# Patient Record
Sex: Female | Born: 1995 | Race: White | Hispanic: No | Marital: Single | Smoking: Never smoker
Health system: Southern US, Community
[De-identification: ages and names within clinical notes are randomized; demographics above are authoritative.]

## PROBLEM LIST (undated history)

## (undated) DIAGNOSIS — R011 Cardiac murmur, unspecified: Secondary | ICD-10-CM

## (undated) HISTORY — DX: Cardiac murmur, unspecified: R01.1

---
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
## 2014-06-13 IMAGING — CR NASAL BONES - 3+ VIEW
1 series · 3 of 3 positions shown · non-contrast
Comparison: none

REASON FOR EXAM: nosebleed s/p trauma
COMMENTS:

PROCEDURE:     DXR - DXR NASAL BONES  - February 09, 2013  [DATE]
RESULT:     Images of the nasal bones show no fracture or bony destruction.

[Series 1: pa waters · 0.17mm/px · 3 of 3 slices shown]
[im 1/3]
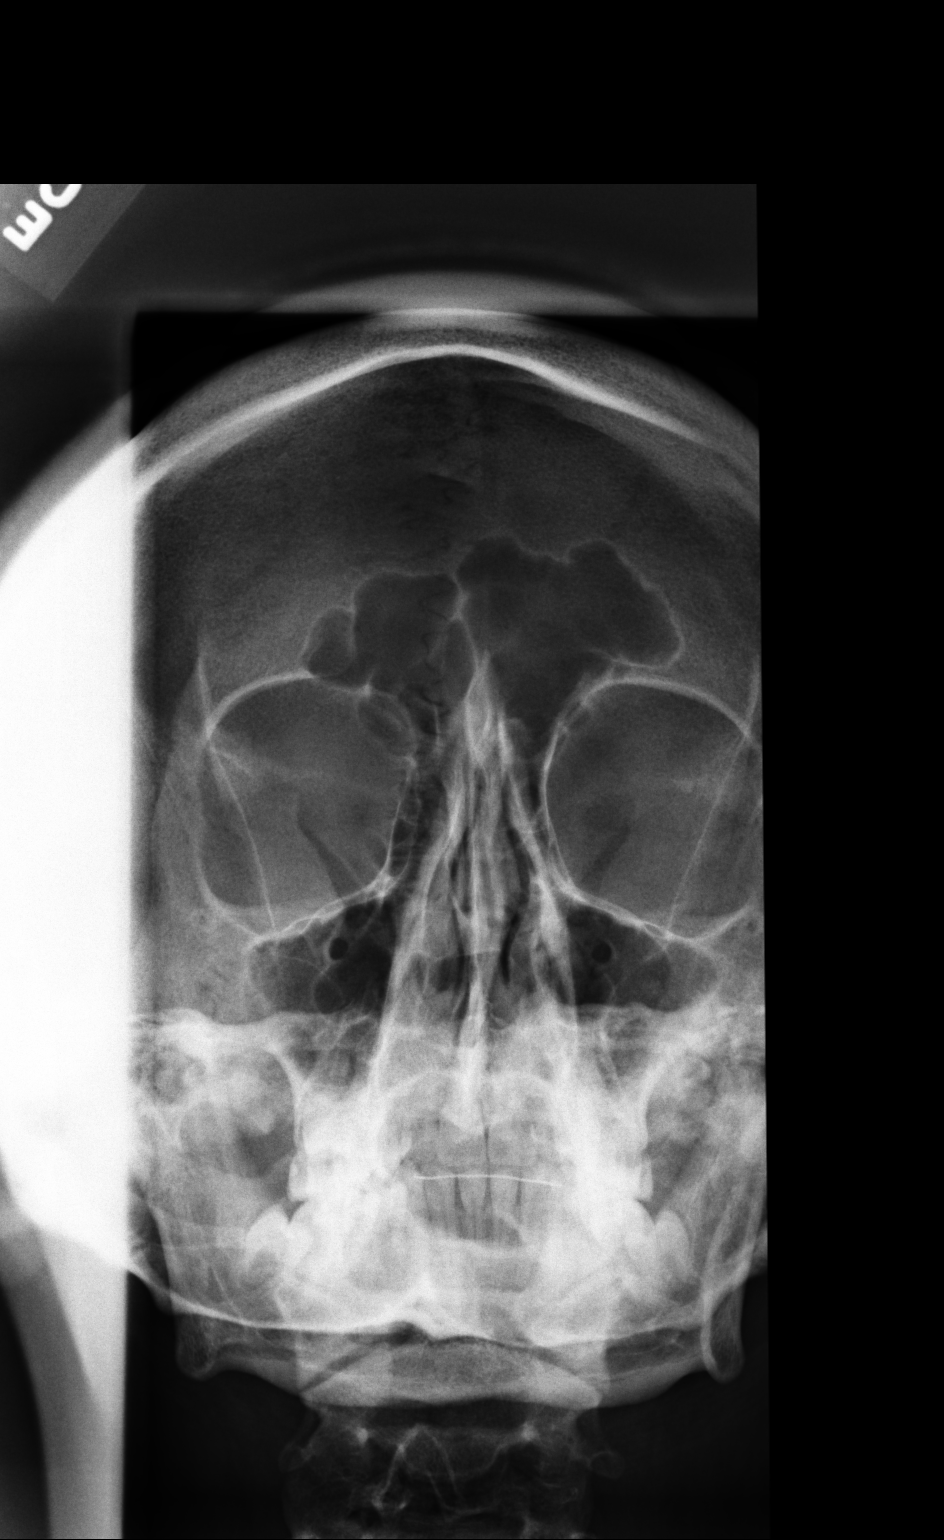
[im 2/3]
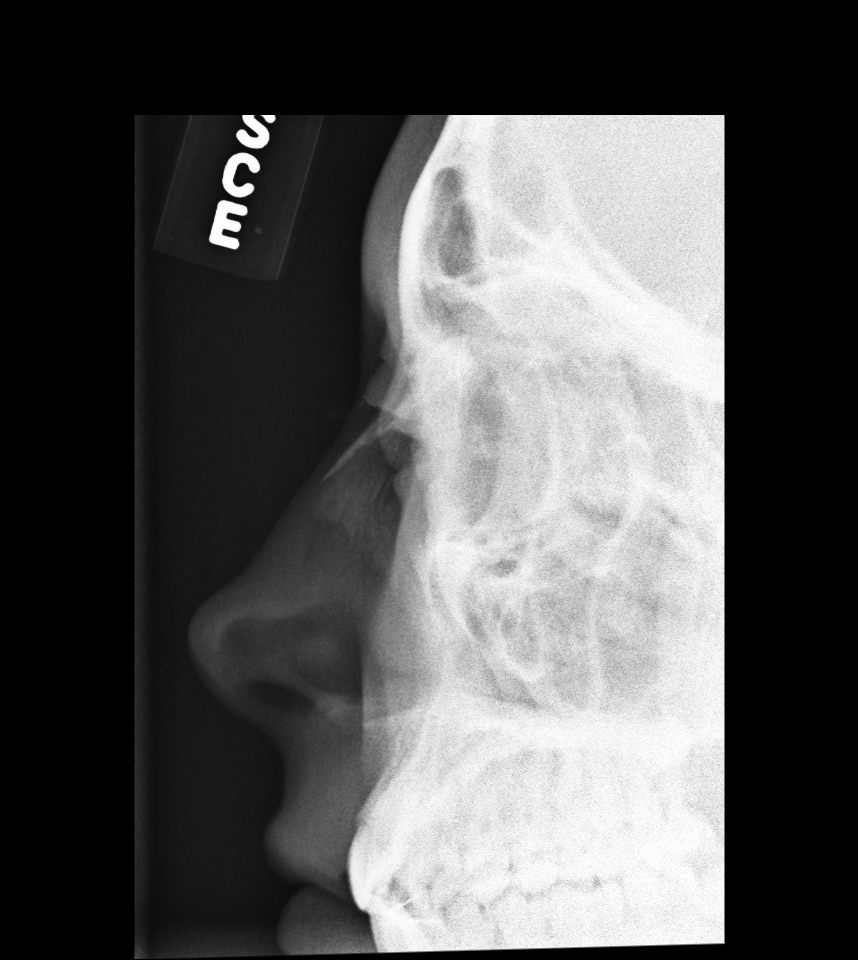
[im 3/3]
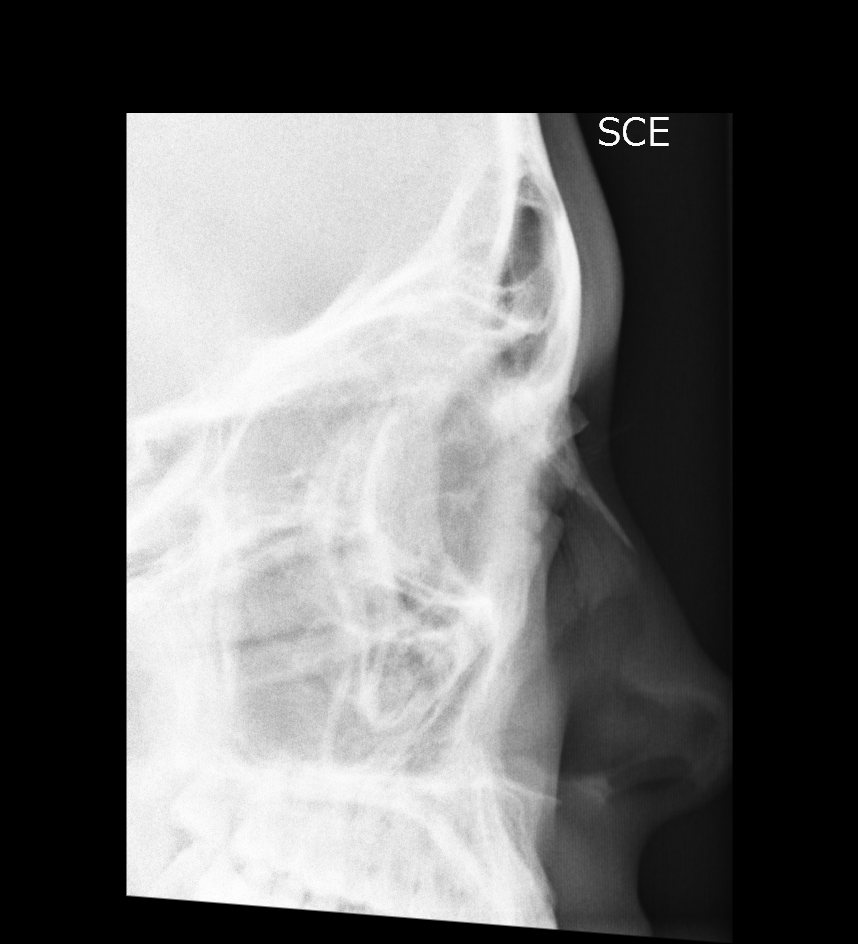

[3 of 3 positions shown; findings below may reference images not displayed]

IMPRESSION: No acute bony abnormality.

[REDACTED]

## 2014-09-11 IMAGING — CR DG THORACIC SPINE 2V
3 series · 3 of 3 positions shown · non-contrast
Comparison: None.

CLINICAL DATA: Back pain scoliosis evaluation.

EXAM:
THORACIC SPINE - 2 VIEW

[view not recorded (1 of 3)]
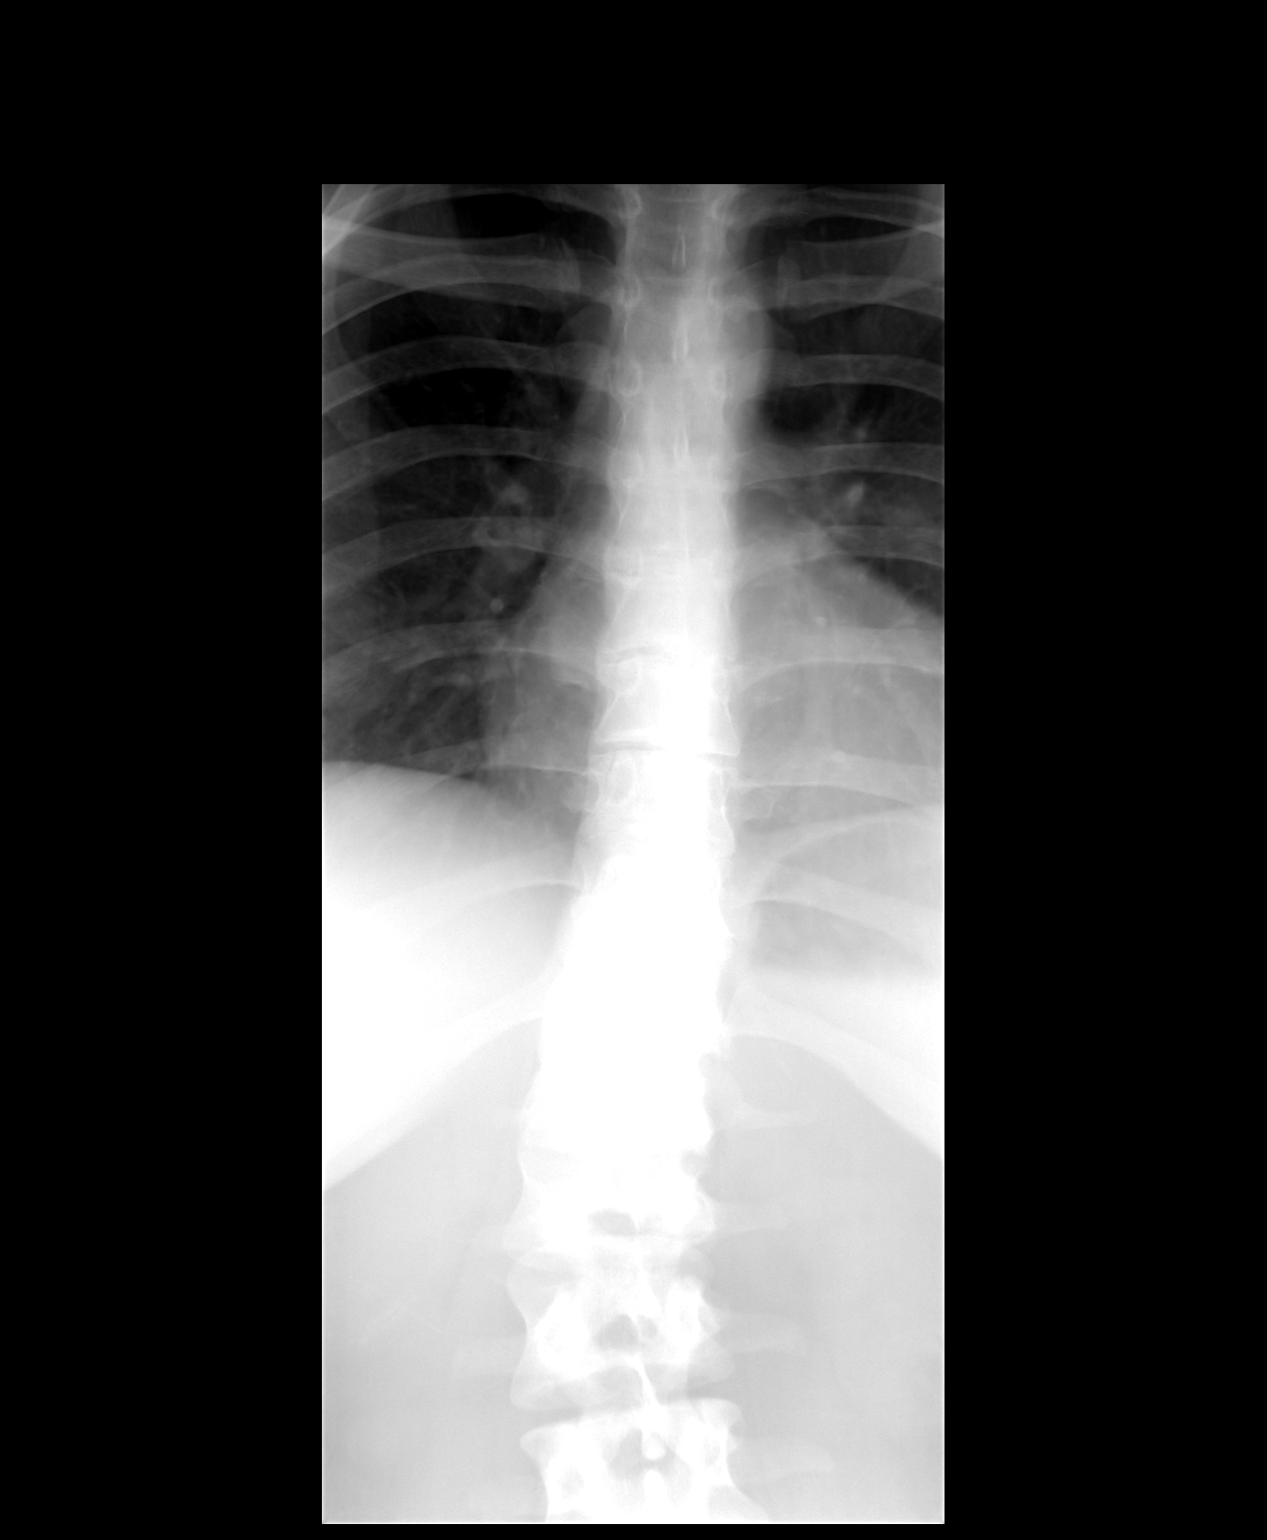

[view not recorded (2 of 3)]
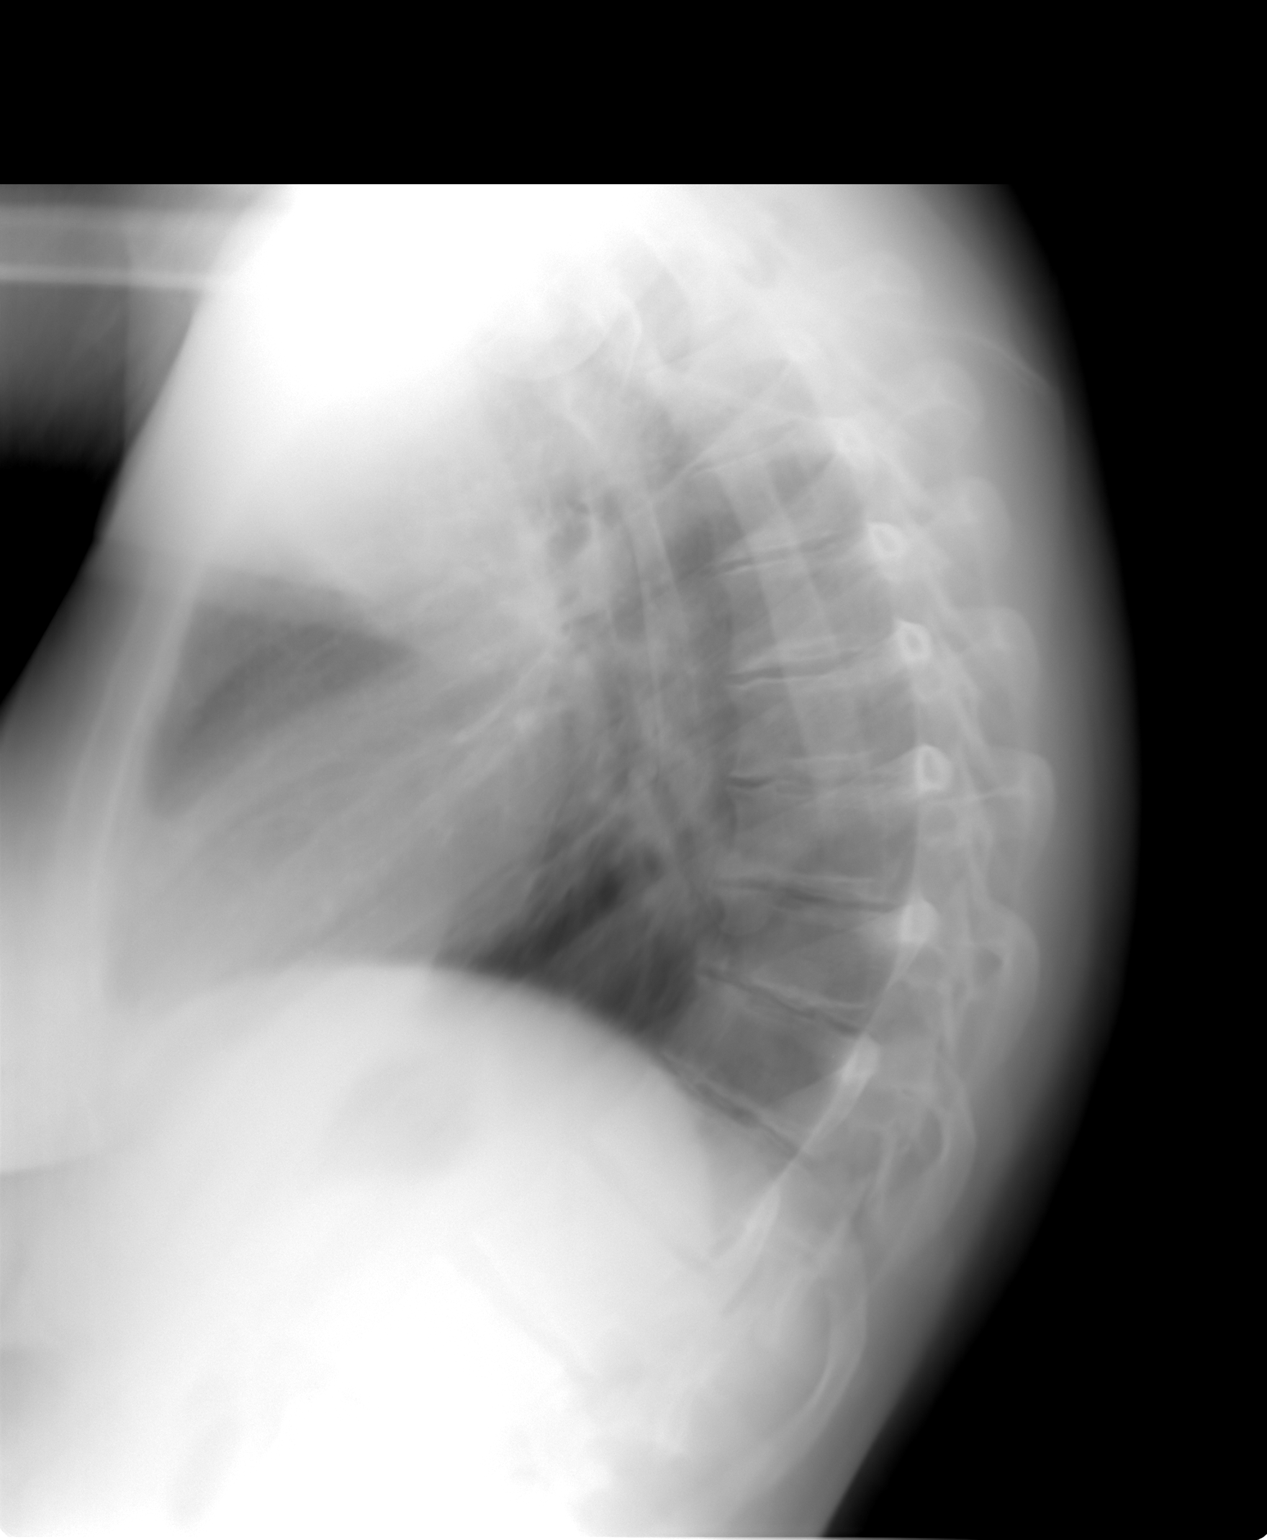

[view not recorded (3 of 3)]
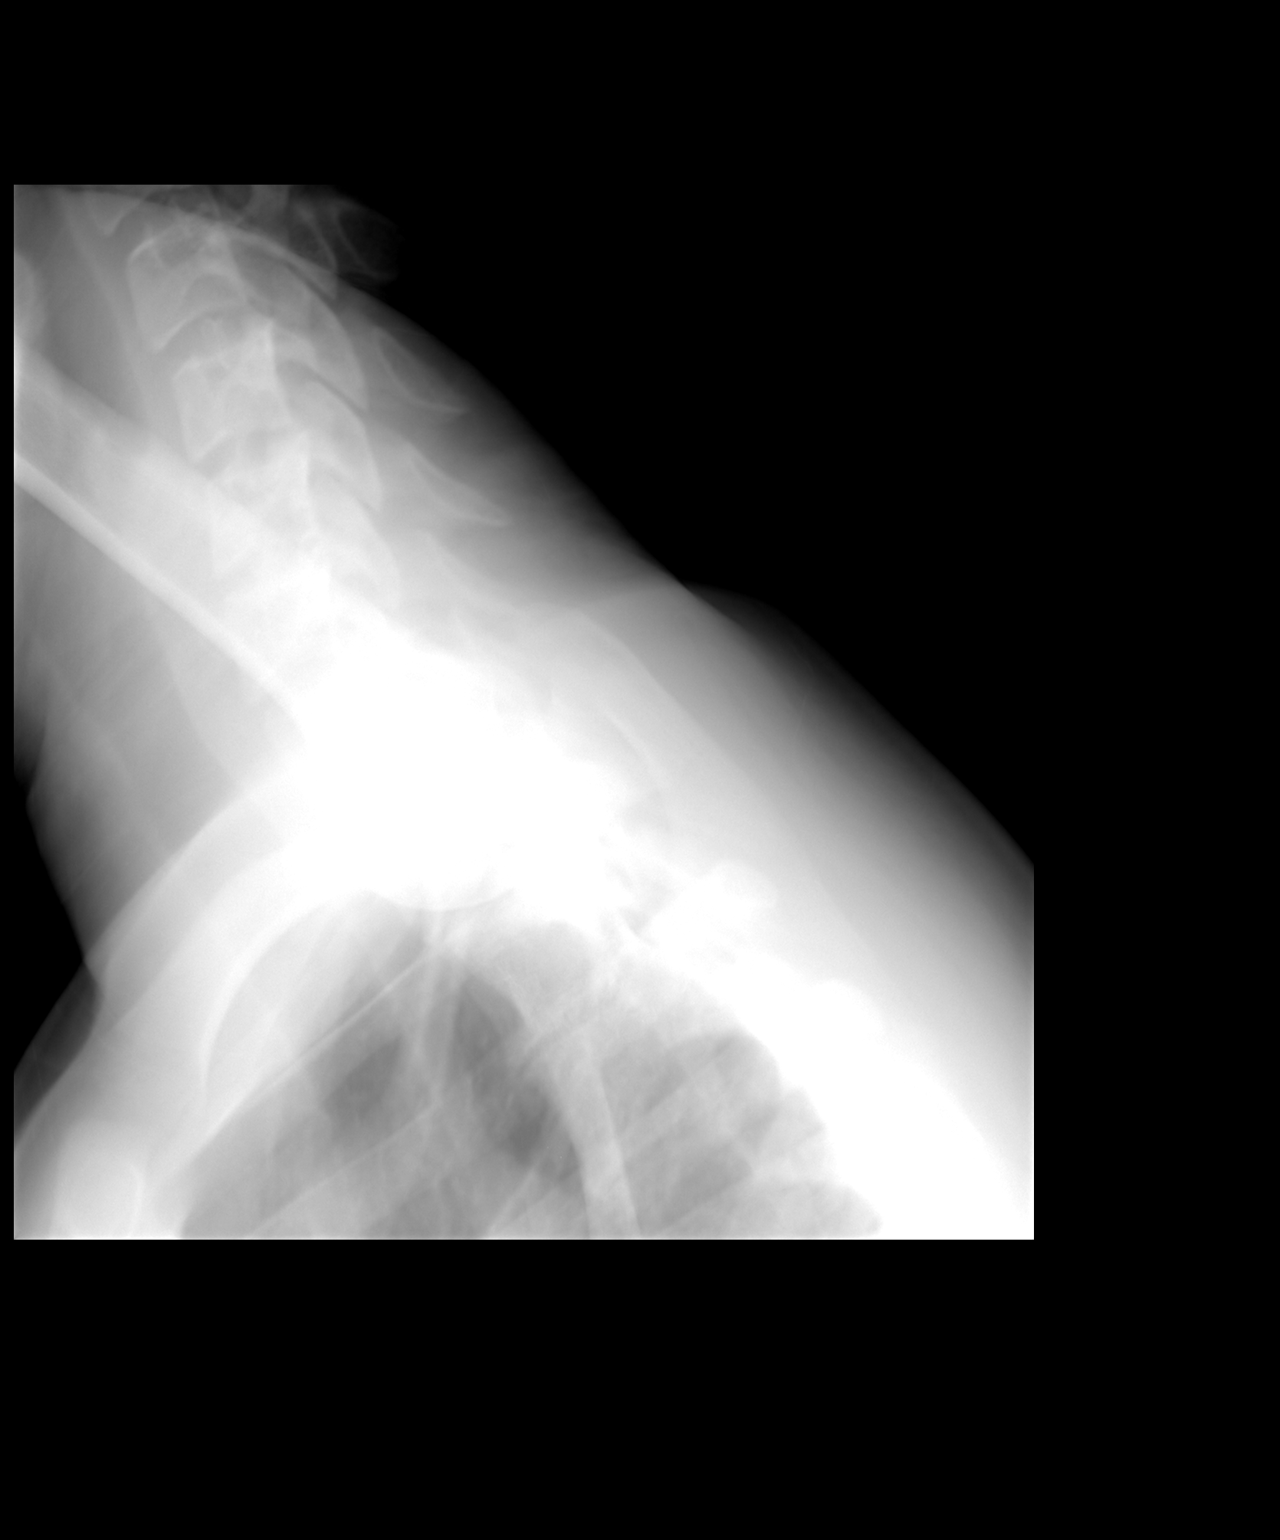

[3 of 3 positions shown; findings below may reference images not displayed]

FINDINGS: There is no evidence of acute fracture nor dislocation.
Dextroscoliosis identified within the visualized portion of
thoracolumbar spine demonstrating approximately 10 degrees of
angulation. The lower lumbar spine is not included on the study.
IMPRESSION: Dextroscoliosis within the thoracolumbar spine without evidence of
acute osseous abnormalities.
